# Patient Record
Sex: Female | Born: 1965 | Race: White | Hispanic: No | Marital: Married | Smoking: Current every day smoker
Health system: Southern US, Community
[De-identification: ages and names within clinical notes are randomized; demographics above are authoritative.]

## PROBLEM LIST (undated history)

## (undated) HISTORY — PX: ABDOMINAL HYSTERECTOMY: SHX81

---

## 2009-12-10 ENCOUNTER — Ambulatory Visit: Payer: Self-pay | Admitting: Family Medicine

## 2009-12-10 DIAGNOSIS — R209 Unspecified disturbances of skin sensation: Secondary | ICD-10-CM | POA: Insufficient documentation

## 2009-12-17 LAB — CONVERTED CEMR LAB
Alkaline Phosphatase: 52 units/L (ref 39–117)
BUN: 10 mg/dL (ref 6–23)
Basophils Absolute: 0 10*3/uL (ref 0.0–0.1)
Basophils Relative: 0 % (ref 0–1)
Eosinophils Absolute: 0.1 10*3/uL (ref 0.0–0.7)
Eosinophils Relative: 1 % (ref 0–5)
Folate: 19.4 ng/mL
Glucose, Bld: 89 mg/dL (ref 70–99)
HCT: 44.7 % (ref 36.0–46.0)
Hemoglobin: 14.6 g/dL (ref 12.0–15.0)
MCHC: 32.7 g/dL (ref 30.0–36.0)
Monocytes Absolute: 0.6 10*3/uL (ref 0.1–1.0)
RDW: 13 % (ref 11.5–15.5)
Total Bilirubin: 0.7 mg/dL (ref 0.3–1.2)
Vit D, 25-Hydroxy: 32 ng/mL (ref 30–89)
Vitamin B-12: 684 pg/mL (ref 211–911)

## 2010-09-20 NOTE — Assessment & Plan Note (Signed)
Summary: COLD HANDS,FEET,DIZZY,NAUSEA,TREMORSn x 2 dys rm 1   Vital Signs:  Patient Profile:   45 Years Old Female CC:      dizzy, nausea, cold feet/hands x 2 dys Height:     65.5 inches Weight:      154 pounds O2 Sat:      99 % O2 treatment:    Room Air Temp:     98.7 degrees F oral Pulse rate:   81 / minute Pulse rhythm:   regular Resp:     16 per minute BP sitting:   129 / 86  (right arm) Cuff size:   regular  Vitals Entered By: Areta Haber CMA (December 10, 2009 5:32 PM)                  Current Allergies: No known allergies History of Present Illness Chief Complaint: dizzy, nausea, cold feet/hands x 2 dys History of Present Illness: Subjective:  Patient complains of 1 to 2 month history of sensation of cold in her hands and feet, worse for the past two days.  She has lost about 15 to 20 poiunds in the recent past.  She had a hysterectomy in Nov 2011 for fibroid tumors.  She remains fatigued.  No fevers, chills, and sweats.  She has occasional tremors in her hands.  Current Problems: DISTURBANCE OF SKIN SENSATION (ICD-782.0)   Current Meds NEXIUM 20 MG CPDR (ESOMEPRAZOLE MAGNESIUM) as needed  REVIEW OF SYSTEMS Constitutional Symptoms      Denies fever, chills, night sweats, weight loss, weight gain, and fatigue.  Eyes       Denies change in vision, eye pain, eye discharge, glasses, contact lenses, and eye surgery. Ear/Nose/Throat/Mouth       Denies hearing loss/aids, change in hearing, ear pain, ear discharge, dizziness, frequent runny nose, frequent nose bleeds, sinus problems, sore throat, hoarseness, and tooth pain or bleeding.  Respiratory       Denies dry cough, productive cough, wheezing, shortness of breath, asthma, bronchitis, and emphysema/COPD.  Cardiovascular       Denies murmurs, chest pain, and tires easily with exhertion.    Gastrointestinal       Complains of nausea/vomiting.      Denies stomach pain, diarrhea, constipation, blood in bowel  movements, and indigestion.      Comments: x 2 dys Genitourniary       Denies painful urination, kidney stones, and loss of urinary control. Neurological       Denies paralysis, seizures, and fainting/blackouts. Musculoskeletal       Denies muscle pain, joint pain, joint stiffness, decreased range of motion, redness, swelling, muscle weakness, and gout.  Skin       Denies bruising, unusual mles/lumps or sores, and hair/skin or nail changes.  Psych       Denies mood changes, temper/anger issues, anxiety/stress, speech problems, depression, and sleep problems. Other Comments: Pt states she has been experiencing cold hand/feet, tremors x 2 dys. Pt has not seen PCP for this .   Past History:  Past Medical History: Unremarkable  Past Surgical History: Hysterectomy  Social History: Current Smoker - 1 pack daily Alcohol use-yes - 5 drinks weekly Drug use-no Regular exercise-no Smoking Status:  current Drug Use:  no Does Patient Exercise:  no   Objective:  No acute distress  Eyes:  Pupils are equal, round, and reactive to light and accomdation.  Extraocular movement is intact.  Conjunctivae are not inflamed.  Ears:  Canals normal.  Tympanic membranes  normal.   Nose:  Normal septum.  Normal turbinates, mildly congested.   No sinus tenderness present.  Pharynx:  Normal  Neck:  Supple.  No adenopathy is present.  No thyromegaly is present  Lungs:  Clear to auscultation.  Breath sounds are equal.  Heart:  Regular rate and rhythm without murmurs, rubs, or gallops. j Abdomen:  Nontender without masses or hepatosplenomegaly.  Bowel sounds are present.  No CVA or flank tenderness.  Extremities:  No edema.  Pedal pulses are full and equal.  Extremities have normal temperature Neurologic:  Cranial nerves 2 through 12 are normal.  Patellar reflexes are normal.  Cerebellar function is intact.  Gait and station are normal.  Assessment New Problems: DISTURBANCE OF SKIN SENSATION  (ICD-782.0)  Unremarkable physical exam.  ? Vitamin deficiency  Plan New Orders: T-CBC w/Diff [[id number]] T-Comprehensive Metabolic Panel [[id number]] T-TSH [[id number]] T-Vitamin B12 [82607-23330] T-Vitamin D (25-Hydroxy) [phone] T-Folate [23340] T- Hemoglobin A1C [83036-23375] New Patient Level III [99203] Planning Comments:   Perform screening lab tests:  CBC, CMP, TSH, Vitamin D level, Vitamin B12 and folate levels Follow-up with PCP   The patient and/or caregiver has been counseled thoroughly with regard to medications prescribed including dosage, schedule, interactions, rationale for use, and possible side effects and they verbalize understanding.  Diagnoses and expected course of recovery discussed and will return if not improved as expected or if the condition worsens. Patient and/or caregiver verbalized understanding.

## 2017-09-15 ENCOUNTER — Emergency Department (INDEPENDENT_AMBULATORY_CARE_PROVIDER_SITE_OTHER): Payer: BLUE CROSS/BLUE SHIELD

## 2017-09-15 ENCOUNTER — Emergency Department
Admission: EM | Admit: 2017-09-15 | Discharge: 2017-09-15 | Disposition: A | Discharge status: Home / Self Care | Payer: BLUE CROSS/BLUE SHIELD | Source: Home / Self Care | Attending: Family Medicine | Admitting: Family Medicine

## 2017-09-15 ENCOUNTER — Encounter: Payer: Self-pay | Admitting: Emergency Medicine

## 2017-09-15 DIAGNOSIS — R05 Cough: Secondary | ICD-10-CM

## 2017-09-15 DIAGNOSIS — J189 Pneumonia, unspecified organism: Secondary | ICD-10-CM

## 2017-09-15 DIAGNOSIS — R0602 Shortness of breath: Secondary | ICD-10-CM

## 2017-09-15 DIAGNOSIS — F17200 Nicotine dependence, unspecified, uncomplicated: Secondary | ICD-10-CM

## 2017-09-15 MED ORDER — ALBUTEROL SULFATE HFA 108 (90 BASE) MCG/ACT IN AERS: 1.0000 | INHALATION_SPRAY | Freq: Four times a day (QID) | RESPIRATORY_TRACT | 0 refills | Status: AC | PRN

## 2017-09-15 MED ORDER — METHYLPREDNISOLONE SODIUM SUCC 40 MG IJ SOLR
80.0000 mg | Freq: Once | INTRAMUSCULAR | Status: AC
  Administered 2017-09-15: 80 mg via INTRAMUSCULAR

## 2017-09-15 MED ORDER — PREDNISONE 20 MG PO TABS: ORAL_TABLET | ORAL | 0 refills | Status: AC

## 2017-09-15 MED ORDER — LEVOFLOXACIN 500 MG PO TABS: 500.0000 mg | ORAL_TABLET | Freq: Every day | ORAL | 0 refills | Status: AC

## 2017-09-15 MED ORDER — CEFTRIAXONE SODIUM 1 G IJ SOLR
1.0000 g | Freq: Once | INTRAMUSCULAR | Status: AC
  Administered 2017-09-15: 1 g via INTRAMUSCULAR

## 2017-09-15 MED ORDER — IPRATROPIUM-ALBUTEROL 0.5-2.5 (3) MG/3ML IN SOLN
3.0000 mL | Freq: Once | RESPIRATORY_TRACT | Status: AC
  Administered 2017-09-15: 3 mL via RESPIRATORY_TRACT

## 2017-09-15 NOTE — ED Triage Notes (Signed)
Patient was seen on Tuesday, negative flu test, productive cough, SOB, fever.

## 2017-09-15 NOTE — Discharge Instructions (Signed)
°  You were given a shot of solumedrol (a steroid) today to help with inflammation in your lungs to help you breath better and to help with your cough.  You have been prescribed 5 days of prednisone, an oral steroid.  You may start this medication tomorrow with breakfast.   ° °

## 2017-09-15 NOTE — ED Provider Notes (Signed)
[facility]    CSN: [id number] Arrival date & time: 09/15/17  1333     History   Chief Complaint Chief Complaint  Patient presents with  . Shortness of Breath    HPI Carolyn Delgado is a 52 y.o. female.   HPI  Carolyn Delgado is a 52 y.o. female presenting to UC with c/o worsening cough, congestion, chest tightness and SOB. She was seen by her PCP 5 days ago for sudden onset body aches, congestion, n/v/d but she tested negative for the flu. Pt was advised her symptoms were viral and to rest.  She is a current smoker but denies hx of asthma or COPD. She has had bronchitis in the past and feels like she has that now. She has used an inhaler in the past but does not have one at this time. Denies n/v/d but did have about 3-4 episodes of vomiting and loose stools yesterday.  Denies chest pain. Denies hx of blood clots. Denies leg pain or swelling.    History reviewed. No pertinent past medical history.  Patient Active Problem List   Diagnosis Date Noted  . DISTURBANCE OF SKIN SENSATION 12/10/2009    Past Surgical History:  Procedure Laterality Date  . ABDOMINAL HYSTERECTOMY      OB History    No data available       Home Medications    Prior to Admission medications   Medication Sig Start Date End Date Taking? Authorizing Provider  albuterol (PROVENTIL HFA;VENTOLIN HFA) 108 (90 Base) MCG/ACT inhaler Inhale 1-2 puffs into the lungs every 6 (six) hours as needed for wheezing or shortness of breath. 09/15/17   Lurene Shadow, PA-C  levofloxacin (LEVAQUIN) 500 MG tablet Take 1 tablet (500 mg total) by mouth daily. 09/15/17   Lurene Shadow, PA-C  predniSONE (DELTASONE) 20 MG tablet 3 tabs po day one, then 2 po daily x 4 days 09/15/17   Lurene Shadow, PA-C    Family History Family History  Problem Relation Age of Onset  . Cancer Sister     Social History Social History   Tobacco Use  . Smoking status: Current Every Day Smoker    Packs/day: 0.50    Types:  Cigarettes  . Smokeless tobacco: Never Used  Substance Use Topics  . Alcohol use: Yes  . Drug use: No     Allergies   Patient has no known allergies.   Review of Systems Review of Systems  Constitutional: Positive for chills and fever.  HENT: Positive for congestion, rhinorrhea and sore throat. Negative for ear pain, trouble swallowing and voice change.   Respiratory: Positive for cough, chest tightness and shortness of breath.   Cardiovascular: Negative for chest pain and palpitations.  Gastrointestinal: Positive for diarrhea and vomiting. Negative for abdominal pain and nausea.  Musculoskeletal: Positive for arthralgias, back pain and myalgias.  Skin: Negative for rash.  Neurological: Positive for headaches. Negative for dizziness and light-headedness.     Physical Exam Triage Vital Signs ED Triage Vitals [09/15/17 1404]  Enc Vitals Group     BP 117/75     Pulse Rate 85     Resp      Temp 98.9 F (37.2 C)     Temp Source Oral     SpO2 (!) 85 %     Weight 180 lb (81.6 kg)     Height 5' 5.5" (1.664 m)     Head Circumference      Peak Flow  Pain Score 0     Pain Loc      Pain Edu?      Excl. in GC?    No data found.  Updated Vital Signs BP 117/75 (BP Location: Right Arm)   Pulse 85   Temp 98.9 F (37.2 C) (Oral)   Ht 5' 5.5" (1.664 m)   Wt 180 lb (81.6 kg)   SpO2 (!) 89%   BMI 29.50 kg/m      Physical Exam  Constitutional: She is oriented to person, place, and time. She appears well-developed and well-nourished.  Non-toxic appearance. She does not appear ill. No distress.  HENT:  Head: Normocephalic and atraumatic.  Right Ear: Tympanic membrane normal.  Left Ear: Tympanic membrane normal.  Nose: Mucosal edema present. Right sinus exhibits no maxillary sinus tenderness and no frontal sinus tenderness. Left sinus exhibits no maxillary sinus tenderness and no frontal sinus tenderness.  Mouth/Throat: Uvula is midline, oropharynx is clear and moist and  mucous membranes are normal.  Eyes: EOM are normal.  Neck: Normal range of motion.  Cardiovascular: Normal rate and regular rhythm.  Pulmonary/Chest: Effort normal. No accessory muscle usage. No respiratory distress. She has decreased breath sounds in the right lower field and the left lower field. She has no wheezes. She has rhonchi ( faint, diffuse when coughing). She has no rales.  Musculoskeletal: Normal range of motion.  Neurological: She is alert and oriented to person, place, and time.  Skin: Skin is warm and dry.  Psychiatric: She has a normal mood and affect. Her behavior is normal.  Nursing note and vitals reviewed.    UC Treatments / Results  Labs (all labs ordered are listed, but only abnormal results are displayed) Labs Reviewed - No data to display  EKG  EKG Interpretation None       Radiology Dg Chest 2 View  Result Date: 09/15/2017 CLINICAL DATA:  Productive cough and shortness of Breath EXAM: CHEST  2 VIEW COMPARISON:  None. FINDINGS: Cardiac shadow is within normal limits. The lungs are well aerated bilaterally. Mild patchy bibasilar changes are noted likely representing early infiltrate. No sizable effusion is noted. Some vague nodular densities are identified particularly within the left lung. No acute bony abnormality is noted. IMPRESSION: Patchy bibasilar infiltrates. Some vague nodular densities are noted. Followup PA and lateral chest X-ray is recommended in 3-4 weeks following trial of antibiotic therapy to ensure resolution and to better evaluate the nodular densities. If they persist CT of the chest is recommended for further evaluation. Electronically Signed   By: Alcide Clever M.D.   On: 09/15/2017 14:36    Procedures Procedures (including critical care time)  Medications Ordered in UC Medications  methylPREDNISolone sodium succinate (SOLU-MEDROL) 40 mg/mL injection 80 mg (80 mg Intramuscular Given 09/15/17 1439)  ipratropium-albuterol (DUONEB) 0.5-2.5  (3) MG/3ML nebulizer solution 3 mL (3 mLs Nebulization Given 09/15/17 1440)  cefTRIAXone (ROCEPHIN) injection 1 g (1 g Intramuscular Given 09/15/17 1500)     Initial Impression / Assessment and Plan / UC Course  I have reviewed the triage vital signs and the nursing notes.  Pertinent labs & imaging results that were available during my care of the patient were reviewed by me and considered in my medical decision making (see chart for details).     Hx and exam c/w pneumonia. Rocephin given in UC Solumedrol and duoneb given in UC. O2 Sat on RA gradually improving. Improved from 85% to 87% and up to 89% upon time of  discharge.  Pt states she feels comfortable being discharged home. Pt accompanied by her husband who is driving Home care instructions provided.  F/u with PCP later this week if not improving Discussed symptoms that warrant emergent care in the ED.   Final Clinical Impressions(s) / UC Diagnoses   Final diagnoses:  SOB (shortness of breath)  Community acquired pneumonia, unspecified laterality  Current smoker    ED Discharge Orders        Ordered    levofloxacin (LEVAQUIN) 500 MG tablet  Daily     09/15/17 1509    predniSONE (DELTASONE) 20 MG tablet     09/15/17 1509    albuterol (PROVENTIL HFA;VENTOLIN HFA) 108 (90 Base) MCG/ACT inhaler  Every 6 hours PRN     09/15/17 1509       Controlled Substance Prescriptions [place] Controlled Substance Registry consulted? Not Applicable   Rolla Platehelps, Lempi Edwin O, PA-C 09/15/17 1649

## 2017-09-19 ENCOUNTER — Encounter: Payer: Self-pay | Admitting: Emergency Medicine

## 2017-09-19 ENCOUNTER — Emergency Department
Admission: EM | Admit: 2017-09-19 | Discharge: 2017-09-19 | Disposition: A | Discharge status: Home / Self Care | Payer: BLUE CROSS/BLUE SHIELD | Source: Home / Self Care | Attending: Family Medicine | Admitting: Family Medicine

## 2017-09-19 DIAGNOSIS — R062 Wheezing: Secondary | ICD-10-CM

## 2017-09-19 DIAGNOSIS — R0602 Shortness of breath: Secondary | ICD-10-CM | POA: Diagnosis not present

## 2017-09-19 MED ORDER — METHYLPREDNISOLONE SODIUM SUCC 40 MG IJ SOLR
40.00 | INTRAMUSCULAR | Status: DC
Start: 2017-09-19 — End: 2017-09-19

## 2017-09-19 MED ORDER — GENERIC EXTERNAL MEDICATION
Status: DC
Start: ? — End: 2017-09-19

## 2017-09-19 MED ORDER — IPRATROPIUM-ALBUTEROL 0.5-2.5 (3) MG/3ML IN SOLN
3.0000 mL | Freq: Four times a day (QID) | RESPIRATORY_TRACT | Status: DC
  Administered 2017-09-19: 3 mL via RESPIRATORY_TRACT

## 2017-09-19 MED ORDER — SODIUM CHLORIDE 0.9 % IV SOLN
INTRAVENOUS | Status: DC
Start: ? — End: 2017-09-19

## 2017-09-19 MED ORDER — PANTOPRAZOLE SODIUM 40 MG PO TBEC
40.00 | DELAYED_RELEASE_TABLET | ORAL | Status: DC
Start: 2017-09-20 — End: 2017-09-19

## 2017-09-19 NOTE — ED Provider Notes (Addendum)
[facility]    CSN: [id number] Arrival date & time: 09/19/17  1025     History   Chief Complaint Chief Complaint  Patient presents with  . Shortness of Breath    HPI Carolyn Delgado is a 52 y.o. female.   HPI Carolyn Delgado is a 52 y.o. female presenting to UC with c/o continued SOB, wheeze, and chest tightness.  She was seen initially on 09/11/17 with flu-like symptoms, tested negative for flu was advised it was viral and needed to run its course.  [facility] symptoms, c/o SOB cough, congestion, wheeze. CXR concerning for bilateral lower lobe pneumonia. She was given solumedrol, duoneb and rocephin, O2 Sat improved from 85% to 89% at that time. She was discharged with prednisone, levaquin and albuterol.    She has taken as prescribed but states last night she could not sleep due to SOB and feeling anxious about the SOB and chest tightness.  No hx of COPD or asthma. Current smoker. CXR on 09/15/17 did two 2 lung nodules, suggested repeat CXR in 3-4 weeks after antibiotics.  Pt denies fever, chills, n/v/d.    History reviewed. No pertinent past medical history.  Patient Active Problem List   Diagnosis Date Noted  . DISTURBANCE OF SKIN SENSATION 12/10/2009    Past Surgical History:  Procedure Laterality Date  . ABDOMINAL HYSTERECTOMY      OB History    No data available       Home Medications    Prior to Admission medications   Medication Sig Start Date End Date Taking? Authorizing Provider  albuterol (PROVENTIL HFA;VENTOLIN HFA) 108 (90 Base) MCG/ACT inhaler Inhale 1-2 puffs into the lungs every 6 (six) hours as needed for wheezing or shortness of breath. 09/15/17   Lurene ShadowPhelps, Jazmine Longshore O, PA-C  levofloxacin (LEVAQUIN) 500 MG tablet Take 1 tablet (500 mg total) by mouth daily. 09/15/17   Lurene ShadowPhelps, Joselyn Edling O, PA-C  predniSONE (DELTASONE) 20 MG tablet 3 tabs po day one, then 2 po daily x 4 days 09/15/17   Lurene ShadowPhelps, Jennelle Pinkstaff O, PA-C    Family History Family  History  Problem Relation Age of Onset  . Cancer Sister     Social History Social History   Tobacco Use  . Smoking status: Current Every Day Smoker    Packs/day: 0.50    Years: 20.00    Pack years: 10.00    Types: Cigarettes  . Smokeless tobacco: Never Used  Substance Use Topics  . Alcohol use: Yes  . Drug use: No     Allergies   Patient has no known allergies.   Review of Systems Review of Systems  Constitutional: Negative for chills and fever.  HENT: Positive for congestion. Negative for ear pain, sore throat, trouble swallowing and voice change.   Respiratory: Positive for cough, chest tightness, shortness of breath and wheezing.   Cardiovascular: Negative for chest pain and palpitations.  Gastrointestinal: Negative for abdominal pain, diarrhea, nausea and vomiting.  Musculoskeletal: Negative for arthralgias, back pain and myalgias.  Skin: Negative for rash.  Neurological: Negative for dizziness, light-headedness and headaches.     Physical Exam Triage Vital Signs ED Triage Vitals  Enc Vitals Group     BP      Pulse      Resp      Temp      Temp src      SpO2      Weight      Height  Head Circumference      Peak Flow      Pain Score      Pain Loc      Pain Edu?      Excl. in GC?    No data found.  Updated Vital Signs BP 134/87 (BP Location: Right Arm)   Pulse 90   Temp 98.1 F (36.7 C) (Oral)   Wt 177 lb (80.3 kg)   SpO2 (!) 86%   BMI 29.01 kg/m   Visual Acuity Right Eye Distance:   Left Eye Distance:   Bilateral Distance:    Right Eye Near:   Left Eye Near:    Bilateral Near:     Physical Exam  Constitutional: She is oriented to person, place, and time. She appears well-developed and well-nourished.  Non-toxic appearance. She does not appear ill. No distress.  HENT:  Head: Normocephalic and atraumatic.  Right Ear: Tympanic membrane normal.  Left Ear: Tympanic membrane normal.  Nose: Nose normal. Right sinus exhibits no  maxillary sinus tenderness and no frontal sinus tenderness. Left sinus exhibits no maxillary sinus tenderness and no frontal sinus tenderness.  Mouth/Throat: Uvula is midline, oropharynx is clear and moist and mucous membranes are normal.  Eyes: EOM are normal.  Neck: Normal range of motion.  Cardiovascular: Normal rate and regular rhythm.  Pulmonary/Chest: Effort normal. No accessory muscle usage or stridor. No tachypnea and no bradypnea. No respiratory distress. She has decreased breath sounds in the right lower field and the left lower field. She has wheezes ( worse in upper lung fields). She has rhonchi (diffuse). She has rales.  Musculoskeletal: Normal range of motion.  Neurological: She is alert and oriented to person, place, and time.  Skin: Skin is warm and dry.  Psychiatric: She has a normal mood and affect. Her behavior is normal.  Nursing note and vitals reviewed.    UC Treatments / Results  Labs (all labs ordered are listed, but only abnormal results are displayed) Labs Reviewed - No data to display  EKG  EKG Interpretation None       Radiology No results found.  Procedures Procedures (including critical care time)  Medications Ordered in UC Medications  ipratropium-albuterol (DUONEB) 0.5-2.5 (3) MG/3ML nebulizer solution 3 mL (3 mLs Nebulization Given 09/19/17 1126)     Initial Impression / Assessment and Plan / UC Course  I have reviewed the triage vital signs and the nursing notes.  Pertinent labs & imaging results that were available during my care of the patient were reviewed by me and considered in my medical decision making (see chart for details).     Pt given duoneb in UC due to O2 Sat at 86% on RA No change after duoneb, O2 Sat 86%  Lung sounds: wheeze with coarse breath sounds.   I saw this pt on 09/15/17.  Pt appears slightly worse despite initial treatment in UC with solumedrol, rocephin and discharge with Levaquin and prednisone Recommend going  to ED for further evaluation and tx. Concern for possible underlying PE, however, pt is low risk, vs complicated pneumonia. Husband accompanying pt, both agreeable with plan.  Husband to drive pt POV to [facility]. Declined EMS transport.     Final Clinical Impressions(s) / UC Diagnoses   Final diagnoses:  SOB (shortness of breath)  Wheeze    ED Discharge Orders    None       Controlled Substance Prescriptions [place] Controlled Substance Registry consulted? Not Applicable   Waylan Rocher  Val Eagle, PA-C 09/19/17 1236    Lurene Shadow, PA-C 09/19/17 1236    Lurene Shadow, [place] 09/19/17 1236

## 2017-09-19 NOTE — ED Triage Notes (Signed)
02 86 after nebulizer.

## 2017-09-19 NOTE — ED Triage Notes (Signed)
Pt c/o SOB and wheezing. She was seen Saturday and dx with pneumonia. Denies improvement even with taking all meds as directed.

## 2019-04-05 IMAGING — DX DG CHEST 2V
2 series · 2 of 2 positions shown · non-contrast
Comparison: None.

CLINICAL DATA: Productive cough and shortness of Breath

EXAM:
CHEST  2 VIEW

[chest pa]
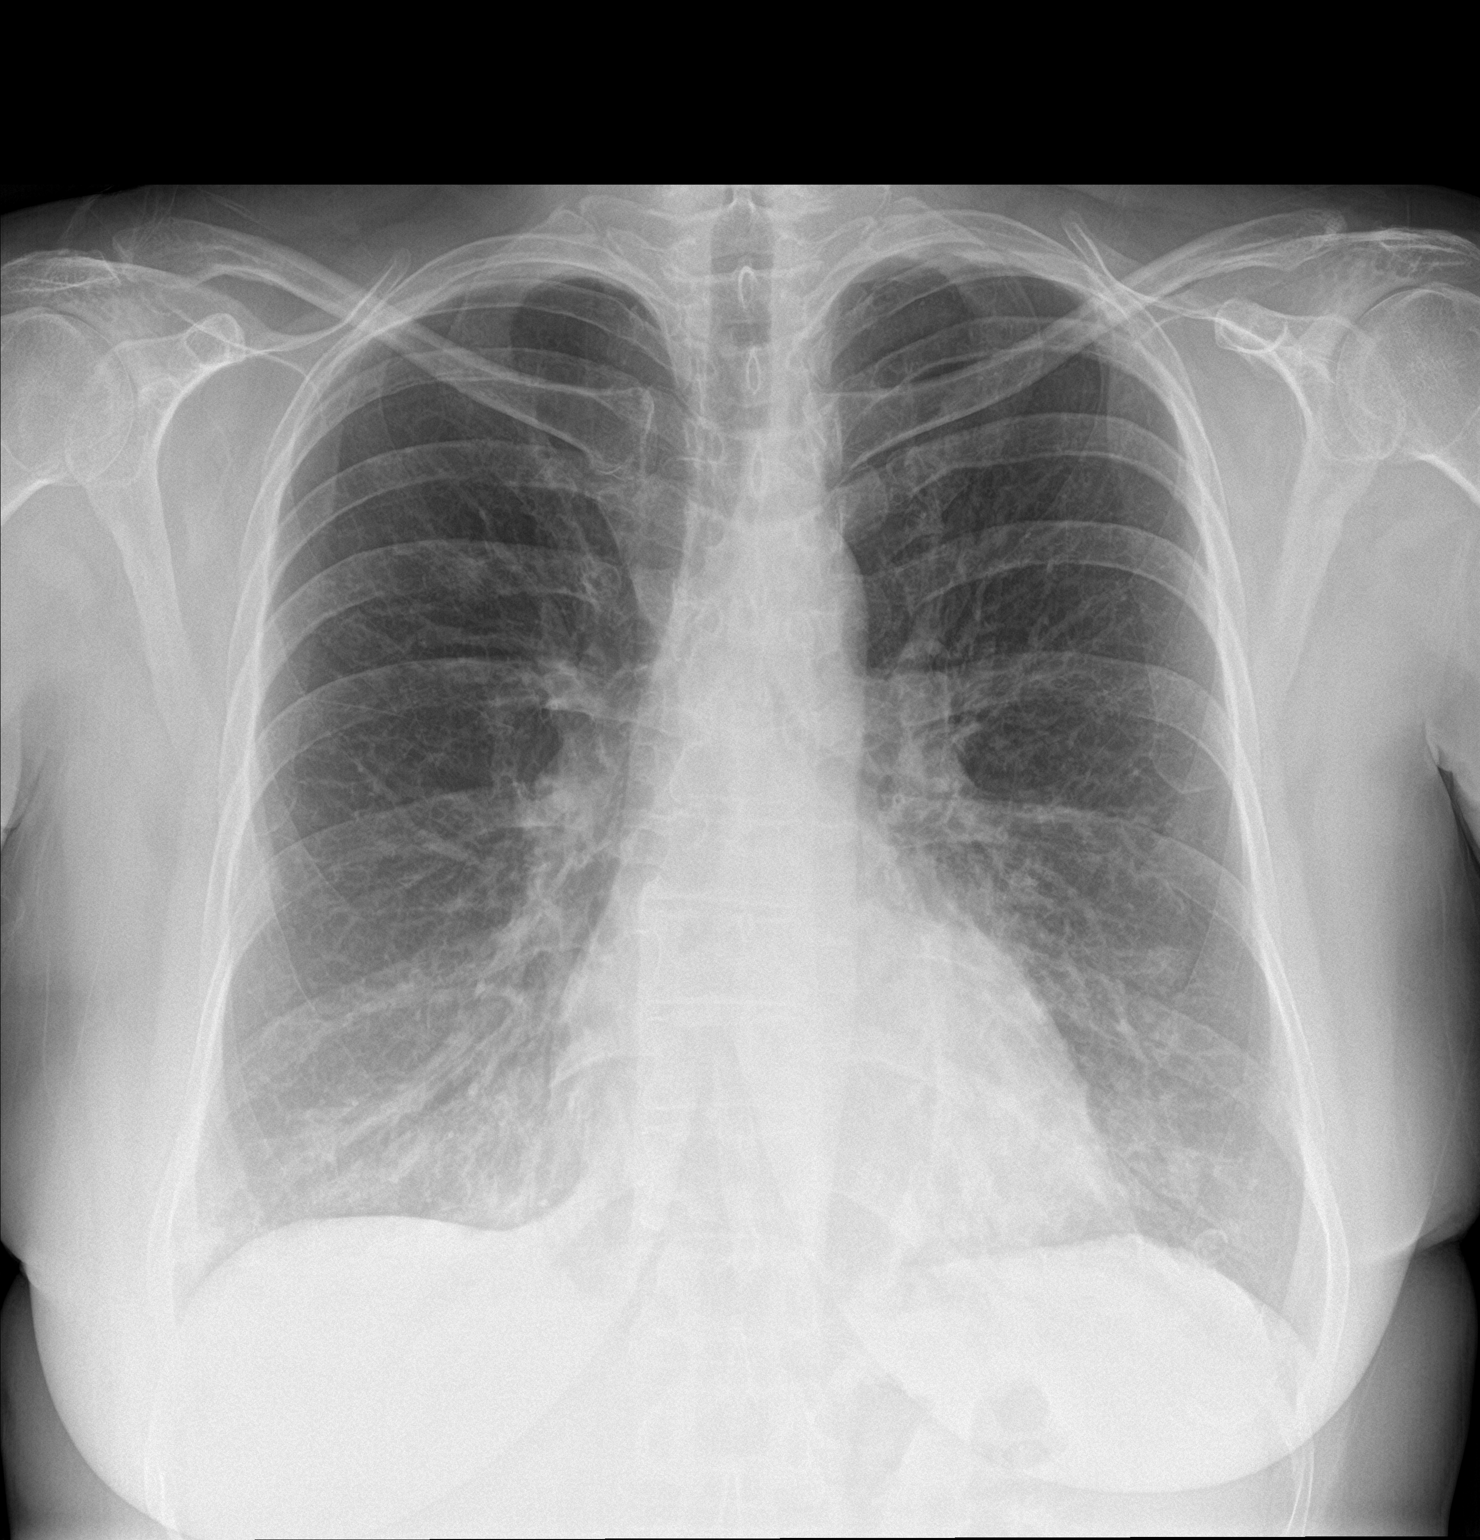

[chest lat]
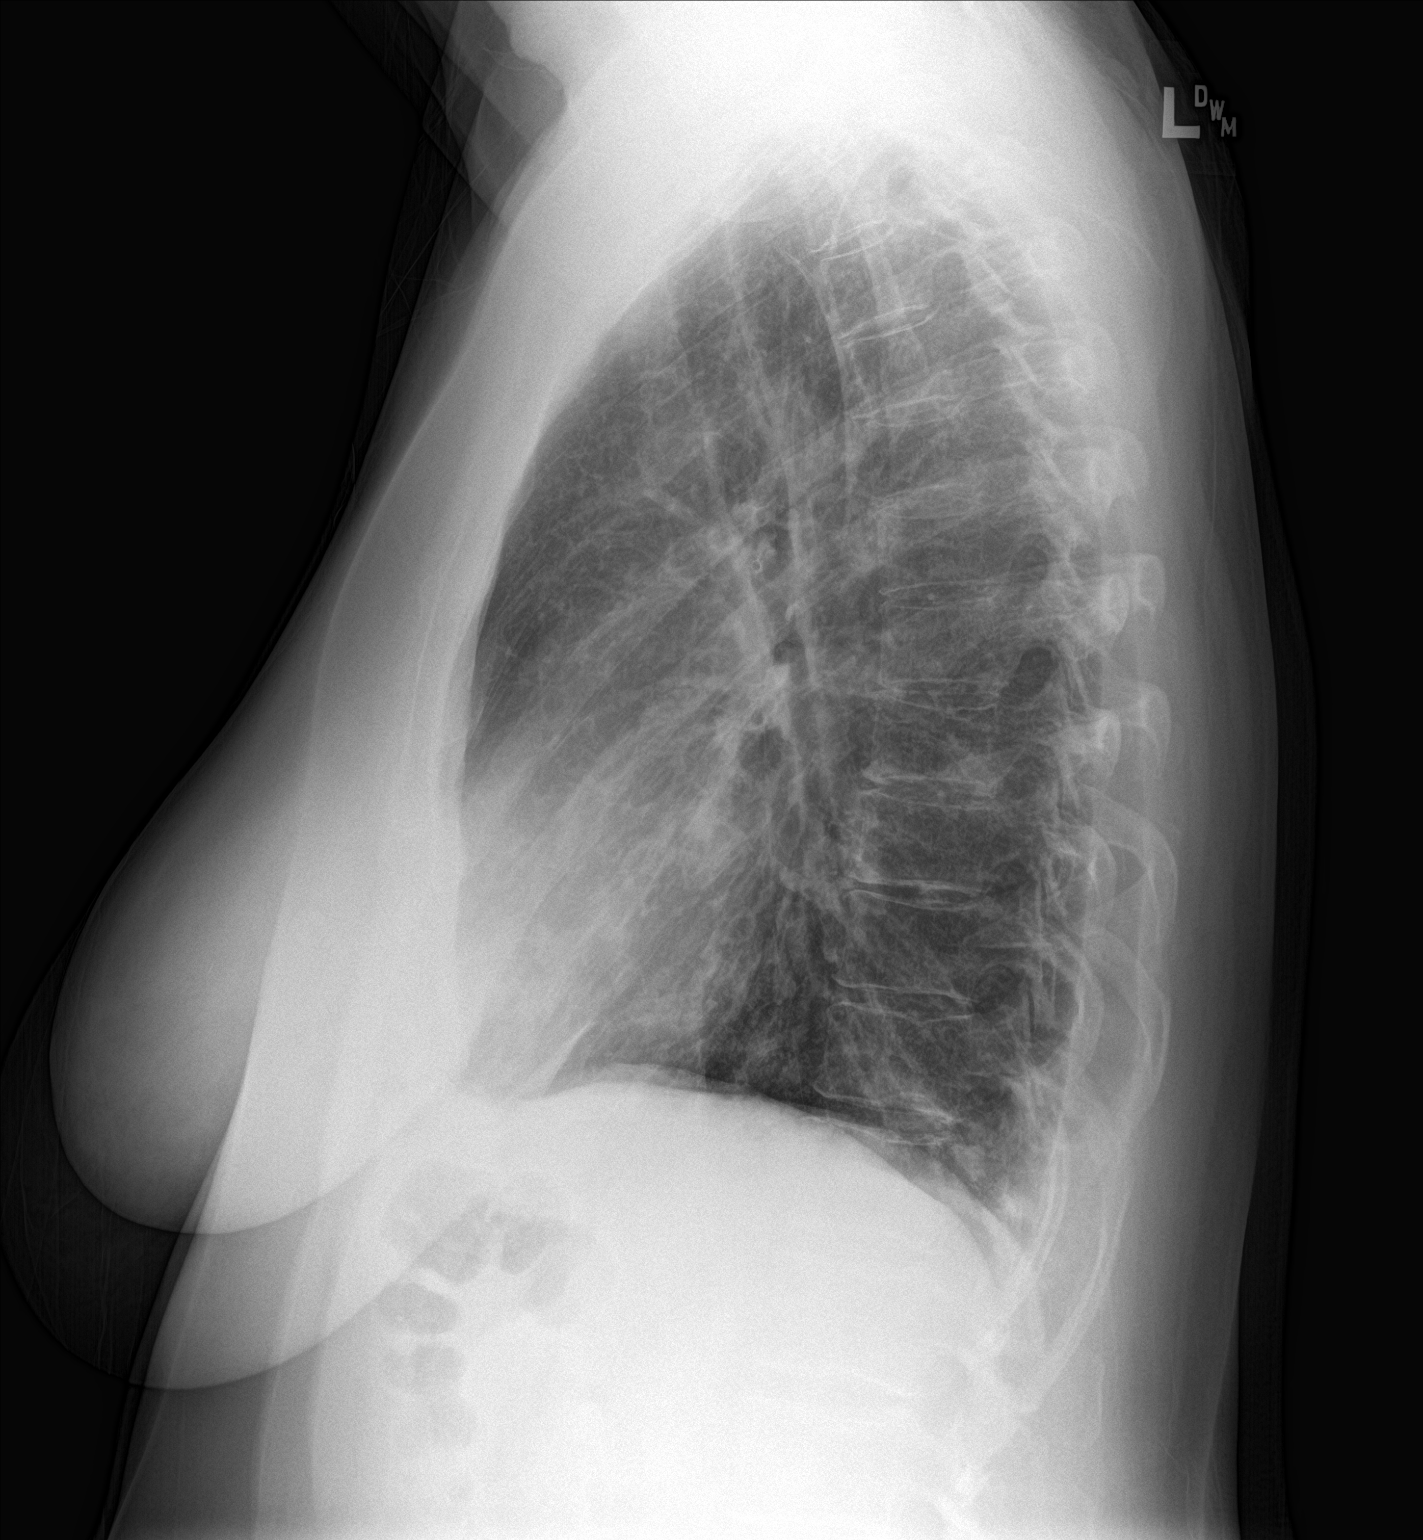

[2 of 2 positions shown; findings below may reference images not displayed]

FINDINGS: Cardiac shadow is within normal limits. The lungs are well aerated
bilaterally. Mild patchy bibasilar changes are noted likely
representing early infiltrate. No sizable effusion is noted. Some
vague nodular densities are identified particularly within the left
lung. No acute bony abnormality is noted.
IMPRESSION: Patchy bibasilar infiltrates. Some vague nodular densities are
noted. Followup PA and lateral chest X-ray is recommended in 3-4
weeks following trial of antibiotic therapy to ensure resolution and
to better evaluate the nodular densities. If they persist CT of the
chest is recommended for further evaluation.

## 2023-03-03 ENCOUNTER — Ambulatory Visit
Admission: EM | Admit: 2023-03-03 | Discharge: 2023-03-03 | Disposition: A | Discharge status: Home / Self Care | Payer: BC Managed Care – PPO

## 2023-03-03 ENCOUNTER — Ambulatory Visit: Payer: BC Managed Care – PPO

## 2023-03-03 DIAGNOSIS — S90935A Unspecified superficial injury of left lesser toe(s), initial encounter: Secondary | ICD-10-CM | POA: Diagnosis not present

## 2023-03-03 DIAGNOSIS — S92515A Nondisplaced fracture of proximal phalanx of left lesser toe(s), initial encounter for closed fracture: Secondary | ICD-10-CM | POA: Diagnosis not present

## 2023-03-03 DIAGNOSIS — S99922A Unspecified injury of left foot, initial encounter: Secondary | ICD-10-CM | POA: Diagnosis not present

## 2023-03-03 MED ORDER — CELECOXIB 200 MG PO CAPS: 200.0000 mg | ORAL_CAPSULE | Freq: Every day | ORAL | 0 refills | Status: AC

## 2023-03-03 MED ORDER — HYDROCODONE-ACETAMINOPHEN 5-325 MG PO TABS: 1.0000 | ORAL_TABLET | Freq: Three times a day (TID) | ORAL | 0 refills | Status: AC | PRN

## 2023-03-03 NOTE — ED Triage Notes (Signed)
Pt presents to uc with co of left 4th toe injury last night. Pt reports she was taking her pants off last night and got hung up and ended up smacking her foot against a laundry hamper. Pt reports this morning she was doing laundry and she took a step and had extreme pain.

## 2023-03-03 NOTE — Discharge Instructions (Addendum)
Instructed patient to take medication as directed with food to completion.  Advised patient may use Norco for breakthrough right fourth toe pain.  Patient advised of sedate of effects of this medication.  Advised patient to wear right cam walker boot 24/7 except with bathing until having [place] orthopedic follow-up.  Contact information provided with this AVS.

## 2023-03-03 NOTE — ED Provider Notes (Signed)
[facility]    CSN: [id number] Arrival date & time: 03/03/23  1030      History   Chief Complaint Chief Complaint  Patient presents with   Toe Injury    HPI Carolyn Delgado is a 57 y.o. female.   HPI Pleasant 57 year old female presents with left foot fourth toe injury that occurred last night.  Patient reports accidentally hitting her left foot against a laundry hamper.  Patient reports toe is extremely painful with weight bearing.  PMH significant for obesity, COPD, and everyday cigarette smoker.  History reviewed. No pertinent past medical history.  Patient Active Problem List   Diagnosis Date Noted   DISTURBANCE OF SKIN SENSATION 12/10/2009    Past Surgical History:  Procedure Laterality Date   ABDOMINAL HYSTERECTOMY      OB History   No obstetric history on file.      Home Medications    Prior to Admission medications   Medication Sig Start Date End Date Taking? Authorizing Provider  celecoxib (CELEBREX) 200 MG capsule Take 1 capsule (200 mg total) by mouth daily for 15 days. 03/03/23 03/18/23 Yes Trevor Iha, FNP  celecoxib (CELEBREX) 200 MG capsule Take 1 capsule (200 mg total) by mouth daily for 15 days. 03/03/23 03/18/23 Yes Trevor Iha, FNP  escitalopram (LEXAPRO) 10 MG tablet 10 mg daily. 10/13/22  Yes [provider]  HYDROcodone-acetaminophen (NORCO/VICODIN) 5-325 MG tablet Take 1 tablet by mouth every 8 (eight) hours as needed for up to 7 days. 03/03/23 03/10/23 Yes Trevor Iha, FNP  HYDROcodone-acetaminophen (NORCO/VICODIN) 5-325 MG tablet Take 1 tablet by mouth every 8 (eight) hours as needed for up to 7 days. 03/03/23 03/10/23 Yes Trevor Iha, FNP  albuterol (PROVENTIL HFA;VENTOLIN HFA) 108 (90 Base) MCG/ACT inhaler Inhale 1-2 puffs into the lungs every 6 (six) hours as needed for wheezing or shortness of breath. 09/15/17   Lurene Shadow, PA-C  levofloxacin (LEVAQUIN) 500 MG tablet Take 1 tablet (500 mg total) by mouth daily.  09/15/17   Lurene Shadow, PA-C  predniSONE (DELTASONE) 20 MG tablet 3 tabs po day one, then 2 po daily x 4 days 09/15/17   Lurene Shadow, PA-C    Family History Family History  Problem Relation Age of Onset   Cancer Sister     Social History Social History   Tobacco Use   Smoking status: Every Day    Current packs/day: 0.50    Average packs/day: 0.5 packs/day for 20.0 years (10.0 ttl pk-yrs)    Types: Cigarettes   Smokeless tobacco: Never  Substance Use Topics   Alcohol use: Yes   Drug use: No     Allergies   Patient has no known allergies.   Review of Systems Review of Systems  Musculoskeletal:        Left fourth toe injury  All other systems reviewed and are negative.    Physical Exam Triage Vital Signs ED Triage Vitals  Encounter Vitals Group     BP      Systolic BP Percentile      Diastolic BP Percentile      Pulse      Resp      Temp      Temp src      SpO2      Weight      Height      Head Circumference      Peak Flow      Pain Score      Pain  Loc      Pain Education      Exclude from Growth Chart    No data found.  Updated Vital Signs BP 124/80   Pulse 63   Temp 98.6 F (37 C)   Resp 18   SpO2 98%       Physical Exam Vitals and nursing note reviewed.  Constitutional:      Appearance: Normal appearance. She is obese.  HENT:     Head: Normocephalic and atraumatic.     Mouth/Throat:     Mouth: Mucous membranes are moist.     Pharynx: Oropharynx is clear.  Eyes:     Extraocular Movements: Extraocular movements intact.     Conjunctiva/sclera: Conjunctivae normal.     Pupils: Pupils are equal, round, and reactive to light.  Cardiovascular:     Rate and Rhythm: Normal rate and regular rhythm.     Pulses: Normal pulses.     Heart sounds: Normal heart sounds.  Pulmonary:     Effort: Pulmonary effort is normal.     Breath sounds: Normal breath sounds. No wheezing, rhonchi or rales.  Musculoskeletal:        General: Normal range  of motion.     Cervical back: Normal range of motion and neck supple.     Comments: Left fourth toe (dorsum) TTP with moderate soft tissue swelling noted  Skin:    General: Skin is warm and dry.  Neurological:     General: No focal deficit present.     Mental Status: She is alert and oriented to person, place, and time. Mental status is at baseline.  Psychiatric:        Mood and Affect: Mood normal.        Behavior: Behavior normal.      UC Treatments / Results  Labs (all labs ordered are listed, but only abnormal results are displayed) Labs Reviewed - No data to display  EKG   Radiology DG Foot Complete Left  Result Date: 03/03/2023 CLINICAL DATA:  Left foot injury. EXAM: LEFT FOOT - COMPLETE 3+ VIEW COMPARISON:  None Available. FINDINGS: Acute nondisplaced oblique fracture of the fourth proximal phalanx without intra-articular extension. No additional fracture. No dislocation. Joint spaces are preserved. Bone mineralization is normal. Soft tissues are unremarkable. IMPRESSION: 1. Acute nondisplaced fourth proximal phalanx fracture. Electronically Signed   By: Obie Dredge M.D.   On: 03/03/2023 11:26    Procedures Procedures (including critical care time)  Medications Ordered in UC Medications - No data to display  Initial Impression / Assessment and Plan / UC Course  I have reviewed the triage vital signs and the nursing notes.  Pertinent labs & imaging results that were available during my care of the patient were reviewed by me and considered in my medical decision making (see chart for details).     MDM: 1.  Nondisplaced fracture of proximal phalanx of the left lesser toes, initial encounter for closed fracture-patient placed in left cam walker boot with specific instructions and [facility]opedic follow-up advised.  Rx'd Norco 5/325 mg tablet take 1 tablet every 8 hours for breakthrough left fourth toe pain; 2.  Toe injury, left, initial encounter-left foot  x-ray revealed above, Rx'd Celebrex 200 mg capsule daily for the next 15 days. Instructed patient to take medication as directed with food to completion.  Advised patient may use Norco for breakthrough right fourth toe pain.  Patient advised of sedate of effects of this medication.  Advised patient  to wear right cam walker boot 24/7 except with bathing until having [place] orthopedic follow-up.  Contact information provided with this AVS. Patient discharged 6 discharged home, hemodynamically stable. Final Clinical Impressions(s) / UC Diagnoses   Final diagnoses:  Toe injury, left, initial encounter  Nondisplaced fracture of proximal phalanx of left lesser toe(s), initial encounter for closed fracture     Discharge Instructions      Instructed patient to take medication as directed with food to completion.  Advised patient may use Norco for breakthrough right fourth toe pain.  Patient advised of sedate of effects of this medication.  Advised patient to wear right cam walker boot 24/7 except with bathing until having [place] orthopedic follow-up.  Contact information provided with this AVS.     ED Prescriptions     Medication Sig Dispense Auth. Provider   celecoxib (CELEBREX) 200 MG capsule Take 1 capsule (200 mg total) by mouth daily for 15 days. 15 capsule Trevor Iha, FNP   HYDROcodone-acetaminophen (NORCO/VICODIN) 5-325 MG tablet Take 1 tablet by mouth every 8 (eight) hours as needed for up to 7 days. 21 tablet Trevor Iha, FNP   celecoxib (CELEBREX) 200 MG capsule Take 1 capsule (200 mg total) by mouth daily for 15 days. 15 capsule Trevor Iha, FNP   HYDROcodone-acetaminophen (NORCO/VICODIN) 5-325 MG tablet Take 1 tablet by mouth every 8 (eight) hours as needed for up to 7 days. 21 tablet Trevor Iha, FNP      I have reviewed the PDMP during this encounter.   Trevor Iha, FNP 03/03/23 1203

## 2024-06-06 ENCOUNTER — Ambulatory Visit
Admission: RE | Admit: 2024-06-06 | Discharge: 2024-06-06 | Disposition: A | Discharge status: Home / Self Care | Attending: Family Medicine | Admitting: Family Medicine

## 2024-06-06 ENCOUNTER — Ambulatory Visit

## 2024-06-06 VITALS — BP 131/83 | HR 77 | Temp 98.3°F | Resp 19

## 2024-06-06 DIAGNOSIS — M79671 Pain in right foot: Secondary | ICD-10-CM | POA: Diagnosis not present

## 2024-06-06 MED ORDER — CELECOXIB 200 MG PO CAPS: 200.0000 mg | ORAL_CAPSULE | Freq: Every day | ORAL | 0 refills | Status: AC

## 2024-06-06 NOTE — ED Provider Notes (Signed)
 [facility]    CSN: [id number] Arrival date & time: 06/06/24  1124      History   Chief Complaint Chief Complaint  Patient presents with   Foot Injury    Possible stress fracture in right foot. - Entered by patient    HPI Lemya Greenwell is a 58 y.o. female.   HPI pleasant 58 year old female presents with right foot pain for 2 days.  Patient believes she may have a stress fracture in her right foot.  PMH significant for city, COPD and RLS.  History reviewed. No pertinent past medical history.  Patient Active Problem List   Diagnosis Date Noted   DISTURBANCE OF SKIN SENSATION 12/10/2009    Past Surgical History:  Procedure Laterality Date   ABDOMINAL HYSTERECTOMY      OB History   No obstetric history on file.      Home Medications    Prior to Admission medications   Medication Sig Start Date End Date Taking? Authorizing Provider  celecoxib  (CELEBREX ) 200 MG capsule Take 1 capsule (200 mg total) by mouth daily for 15 days. 06/06/24 06/21/24 Yes Teddy Sharper, FNP  albuterol  (PROVENTIL  HFA;VENTOLIN  HFA) 108 (90 Base) MCG/ACT inhaler Inhale 1-2 puffs into the lungs every 6 (six) hours as needed for wheezing or shortness of breath. 09/15/17   Anitra Rocky KIDD, PA-C  escitalopram (LEXAPRO) 10 MG tablet 10 mg daily. 10/13/22   [provider]  levofloxacin  (LEVAQUIN ) 500 MG tablet Take 1 tablet (500 mg total) by mouth daily. 09/15/17   Anitra Rocky KIDD, PA-C  predniSONE  (DELTASONE ) 20 MG tablet 3 tabs po day one, then 2 po daily x 4 days 09/15/17   Anitra Rocky KIDD, PA-C    Family History Family History  Problem Relation Age of Onset   Cancer Sister     Social History Social History   Tobacco Use   Smoking status: Every Day    Current packs/day: 0.50    Average packs/day: 0.5 packs/day for 20.0 years (10.0 ttl pk-yrs)    Types: Cigarettes   Smokeless tobacco: Never  Substance Use Topics   Alcohol use: Yes   Drug use: No     Allergies    Patient has no known allergies.   Review of Systems Review of Systems  Musculoskeletal:        Right foot pain x 2 days  All other systems reviewed and are negative.    Physical Exam Triage Vital Signs ED Triage Vitals  Encounter Vitals Group     BP 06/06/24 1158 131/83     Girls Systolic BP Percentile --      Girls Diastolic BP Percentile --      Boys Systolic BP Percentile --      Boys Diastolic BP Percentile --      Pulse Rate 06/06/24 1158 77     Resp 06/06/24 1158 19     Temp 06/06/24 1158 98.3 F (36.8 C)     Temp src --      SpO2 06/06/24 1158 98 %     Weight --      Height --      Head Circumference --      Peak Flow --      Pain Score 06/06/24 1157 7     Pain Loc --      Pain Education --      Exclude from Growth Chart --    No data found.  Updated Vital Signs BP 131/83  Pulse 77   Temp 98.3 F (36.8 C)   Resp 19   SpO2 98%   Visual Acuity Right Eye Distance:   Left Eye Distance:   Bilateral Distance:    Right Eye Near:   Left Eye Near:    Bilateral Near:     Physical Exam Vitals and nursing note reviewed.  Constitutional:      Appearance: Normal appearance. She is normal weight.  HENT:     Head: Normocephalic and atraumatic.     Mouth/Throat:     Mouth: Mucous membranes are moist.     Pharynx: Oropharynx is clear.  Eyes:     Extraocular Movements: Extraocular movements intact.     Pupils: Pupils are equal, round, and reactive to light.  Cardiovascular:     Rate and Rhythm: Normal rate and regular rhythm.     Heart sounds: Normal heart sounds.  Pulmonary:     Effort: Pulmonary effort is normal.     Breath sounds: Normal breath sounds. No wheezing, rhonchi or rales.  Musculoskeletal:        General: Normal range of motion.     Comments: Right foot (dorsum): TTP over 4th and 5th metatarsal heads-no deformity noted  Skin:    General: Skin is warm and dry.  Neurological:     General: No focal deficit present.     Mental Status:  She is alert and oriented to person, place, and time. Mental status is at baseline.  Psychiatric:        Mood and Affect: Mood normal.        Behavior: Behavior normal.      UC Treatments / Results  Labs (all labs ordered are listed, but only abnormal results are displayed) Labs Reviewed - No data to display  EKG   Radiology DG Foot Complete Right Result Date: 06/06/2024 EXAM: 3 OR MORE VIEW(S) XRAY OF THE RIGHT FOOT 06/06/2024 12:17:40 PM COMPARISON: None available. CLINICAL HISTORY: Right  lateral foot pain. FINDINGS: BONES AND JOINTS: No acute fracture. No joint dislocation. Dorsal spurring at the naviculocuneiform articulation. Plantar calcaneal spur. SOFT TISSUES: The soft tissues are unremarkable. IMPRESSION: 1. No acute fracture or dislocation. 2. Plantar calcaneal spur. Electronically signed by: Harrietta Sherry MD 06/06/2024 01:05 PM EDT RP Workstation: [id number]    Procedures Procedures (including critical care time)  Medications Ordered in UC Medications - No data to display  Initial Impression / Assessment and Plan / UC Course  I have reviewed the triage vital signs and the nursing notes.  Pertinent labs & imaging results that were available during my care of the patient were reviewed by me and considered in my medical decision making (see chart for details).     MDM: 1.  Right foot pain-right foot x-ray results revealed above, patient advised, Rx'd Celebrex  200 mg capsule: Take 1 capsule daily x 15 days.  Advised patient of right foot x-ray results with hardcopy provided.  Advised patient to take medication as directed with food to completion.  Encouraged to increase daily water intake to 64 ounces per day.  Advised if symptoms worsen and/or unresolved please follow-up with your PCP, [facility] podiatry or here for further evaluation. Final Clinical Impressions(s) / UC Diagnoses   Final diagnoses:  Right foot pain     Discharge Instructions      Advised  patient of right foot x-ray results with hardcopy provided.  Advised patient to take medication as directed with food to completion.  Encouraged to increase daily  water intake to 64 ounces per day.  Advised if symptoms worsen and/or unresolved please follow-up with your PCP, [facility] podiatry or here for further evaluation.     ED Prescriptions     Medication Sig Dispense Auth. Provider   celecoxib  (CELEBREX ) 200 MG capsule Take 1 capsule (200 mg total) by mouth daily for 15 days. 15 capsule Hadiya Spoerl, FNP      PDMP not reviewed this encounter.   Teddy Sharper, FNP 06/06/24 1337

## 2024-06-06 NOTE — Discharge Instructions (Addendum)
 Advised patient of right foot x-ray results with hardcopy provided.  Advised patient to take medication as directed with food to completion.  Encouraged to increase daily water intake to 64 ounces per day.  Advised if symptoms worsen and/or unresolved please follow-up with your PCP, [facility] podiatry or here for further evaluation.

## 2024-06-06 NOTE — ED Triage Notes (Signed)
 Pt presents to uc with right foot pain for 2 days. Pt reports she thinks she may have a stress fracture as she has had one before in the same foot and this feels the same. Pt reports she has been taking tylenol  for pain.
